# Patient Record
Sex: Female | Born: 1990 | Race: Black or African American | Hispanic: No | Marital: Single | Smoking: Never smoker
Health system: Southern US, Community
[De-identification: ages and names within clinical notes are randomized; demographics above are authoritative.]

---

## 2013-12-07 ENCOUNTER — Emergency Department: Payer: Self-pay | Admitting: Emergency Medicine

## 2013-12-07 LAB — URINALYSIS, COMPLETE
Bacteria: NONE SEEN
Bilirubin,UR: NEGATIVE
Glucose,UR: NEGATIVE mg/dL (ref 0–75)
Ketone: NEGATIVE
Leukocyte Esterase: NEGATIVE
Nitrite: NEGATIVE
Ph: 6 (ref 4.5–8.0)
Protein: NEGATIVE
RBC,UR: 342 /HPF (ref 0–5)
SPECIFIC GRAVITY: 1.012 (ref 1.003–1.030)
WBC UR: 1 /HPF (ref 0–5)

## 2013-12-07 LAB — CBC
HCT: 37.1 % (ref 35.0–47.0)
HGB: 12.8 g/dL (ref 12.0–16.0)
MCH: 31.5 pg (ref 26.0–34.0)
MCHC: 34.4 g/dL (ref 32.0–36.0)
MCV: 92 fL (ref 80–100)
PLATELETS: 167 10*3/uL (ref 150–440)
RBC: 4.05 10*6/uL (ref 3.80–5.20)
RDW: 12.8 % (ref 11.5–14.5)
WBC: 8.3 10*3/uL (ref 3.6–11.0)

## 2013-12-07 LAB — WET PREP, GENITAL

## 2013-12-07 LAB — HCG, QUANTITATIVE, PREGNANCY: BETA HCG, QUANT.: 844 m[IU]/mL — AB

## 2013-12-07 LAB — GC/CHLAMYDIA PROBE AMP

## 2014-08-09 NOTE — L&D Delivery Note (Signed)
Delivery Note  First Stage: Labor onset: 05/26/15 @ 2230  Augmentation : Pitocin Analgesia Eliezer Lofts/Anesthesia intrapartum: Epidural SROM at home at 2400 clear fluid  Second Stage: Complete dilation at 1535 Onset of pushing at 1728 FHR second stage: Baseline 125 / moderate variability / early and variables with contractions to a nadir of 80 / + accels  Delivery of a viable female at [id number] by Carlean JewsMeredith Tykira Wachs, CNM in ROA position - Dr. Dalbert GarnetBeasley in the room for standby No nuchal cord Cord double clamped after cessation of pulsation, cut by CNM Cord blood sample collected  Arterial cord blood sample collected for 1 minute Apgar of 5  Third Stage: Placenta delivered via Tomasa BlaseSchultz intact with 3 VC @ 1839 Placenta disposition: home with patient and FOB Uterine tone firm with massage / bleeding moderate   See Dr. Francena HanlyBeasley's progress note for detail of repair - 3rd degree lac with bilateral labial extensions and stellate vaginal lacs noted -  vaginal packing placed x 2 hours Est. Blood Loss (mL): 400  Complications: extensive repair requiring 1.75 hours and vaginal packing x 2 hours   Mom to postpartum.  Baby to Couplet care / Skin to Skin.  Newborn: Birth Weight: 8#6oz Apgar Scores: 5, 8 Feeding planned: Breast  Karena AddisonMeredith C Haiden Clucas, CNM

## 2014-10-10 LAB — OB RESULTS CONSOLE VARICELLA ZOSTER ANTIBODY, IGG: Varicella: NON-IMMUNE/NOT IMMUNE

## 2014-10-10 LAB — OB RESULTS CONSOLE GC/CHLAMYDIA
Chlamydia: NEGATIVE
Gonorrhea: NEGATIVE

## 2014-10-10 LAB — OB RESULTS CONSOLE HEPATITIS B SURFACE ANTIGEN: HEP B S AG: NEGATIVE

## 2014-10-10 LAB — OB RESULTS CONSOLE HIV ANTIBODY (ROUTINE TESTING): HIV: NONREACTIVE

## 2014-10-10 LAB — OB RESULTS CONSOLE GBS: STREP GROUP B AG: POSITIVE

## 2014-10-10 LAB — OB RESULTS CONSOLE RUBELLA ANTIBODY, IGM: RUBELLA: IMMUNE

## 2015-01-09 ENCOUNTER — Ambulatory Visit: Admission: RE | Admit: 2015-01-09 | Payer: Self-pay | Source: Ambulatory Visit

## 2015-01-13 ENCOUNTER — Ambulatory Visit: Payer: Self-pay

## 2015-05-27 ENCOUNTER — Inpatient Hospital Stay: Payer: 59 | Admitting: Certified Registered"

## 2015-05-27 ENCOUNTER — Inpatient Hospital Stay
Admission: EM | Admit: 2015-05-27 | Discharge: 2015-05-29 | DRG: 775 | Disposition: A | Discharge status: Home / Self Care | Payer: 59 | Attending: Obstetrics and Gynecology | Admitting: Obstetrics and Gynecology

## 2015-05-27 ENCOUNTER — Other Ambulatory Visit: Payer: Self-pay | Admitting: Obstetrics and Gynecology

## 2015-05-27 DIAGNOSIS — O288 Other abnormal findings on antenatal screening of mother: Secondary | ICD-10-CM | POA: Diagnosis present

## 2015-05-27 DIAGNOSIS — Z3A40 40 weeks gestation of pregnancy: Secondary | ICD-10-CM | POA: Diagnosis not present

## 2015-05-27 LAB — CBC
HCT: 36.1 % (ref 35.0–47.0)
Hemoglobin: 12.3 g/dL (ref 12.0–16.0)
MCH: 29.7 pg (ref 26.0–34.0)
MCHC: 33.9 g/dL (ref 32.0–36.0)
MCV: 87.6 fL (ref 80.0–100.0)
PLATELETS: 141 10*3/uL — AB (ref 150–440)
RBC: 4.12 MIL/uL (ref 3.80–5.20)
RDW: 14.8 % — AB (ref 11.5–14.5)
WBC: 9.1 10*3/uL (ref 3.6–11.0)

## 2015-05-27 LAB — CHLAMYDIA/NGC RT PCR (ARMC ONLY)
Chlamydia Tr: NOT DETECTED
N gonorrhoeae: NOT DETECTED

## 2015-05-27 LAB — TYPE AND SCREEN
ABO/RH(D): A NEG
Antibody Screen: NEGATIVE

## 2015-05-27 MED ORDER — BUTORPHANOL TARTRATE 1 MG/ML IJ SOLN: 1.0000 mg | INTRAMUSCULAR | Status: DC | PRN

## 2015-05-27 MED ORDER — ESTROGENS, CONJUGATED 0.625 MG/GM VA CREA
1.0000 | TOPICAL_CREAM | Freq: Every day | VAGINAL | Status: DC
  Administered 2015-05-28 – 2015-05-29 (×2): 1 via VAGINAL
  Filled 2015-05-27 (×2): qty 30

## 2015-05-27 MED ORDER — DIPHENHYDRAMINE HCL 50 MG/ML IJ SOLN: 12.5000 mg | INTRAMUSCULAR | Status: DC | PRN

## 2015-05-27 MED ORDER — OXYCODONE-ACETAMINOPHEN 5-325 MG PO TABS
1.0000 | ORAL_TABLET | ORAL | Status: DC | PRN
  Administered 2015-05-28 (×3): 1 via ORAL
  Filled 2015-05-27 (×3): qty 1

## 2015-05-27 MED ORDER — OXYTOCIN 40 UNITS IN LACTATED RINGERS INFUSION - SIMPLE MED: 1.0000 m[IU]/min | INTRAVENOUS | Status: DC

## 2015-05-27 MED ORDER — SODIUM CHLORIDE 0.9 % IV SOLN
INTRAVENOUS | Status: AC
  Administered 2015-05-27: 1 g via INTRAVENOUS
  Filled 2015-05-27: qty 1000

## 2015-05-27 MED ORDER — ONDANSETRON HCL 4 MG/2ML IJ SOLN: 4.0000 mg | Freq: Four times a day (QID) | INTRAMUSCULAR | Status: DC | PRN

## 2015-05-27 MED ORDER — CITRIC ACID-SODIUM CITRATE 334-500 MG/5ML PO SOLN: 30.0000 mL | ORAL | Status: DC | PRN

## 2015-05-27 MED ORDER — OXYCODONE-ACETAMINOPHEN 5-325 MG PO TABS
ORAL_TABLET | ORAL | Status: AC
  Filled 2015-05-27: qty 1

## 2015-05-27 MED ORDER — OXYTOCIN 40 UNITS IN LACTATED RINGERS INFUSION - SIMPLE MED
1.0000 m[IU]/min | INTRAVENOUS | Status: DC
  Administered 2015-05-27: 7 m[IU]/min via INTRAVENOUS
  Administered 2015-05-27: 9 m[IU]/min via INTRAVENOUS
  Administered 2015-05-27: 1 m[IU]/min via INTRAVENOUS
  Administered 2015-05-27: 5 m[IU]/min via INTRAVENOUS
  Administered 2015-05-27: 11 m[IU]/min via INTRAVENOUS
  Administered 2015-05-27: 3 m[IU]/min via INTRAVENOUS

## 2015-05-27 MED ORDER — LACTATED RINGERS IV SOLN: 500.0000 mL | INTRAVENOUS | Status: DC | PRN

## 2015-05-27 MED ORDER — OXYCODONE-ACETAMINOPHEN 5-325 MG PO TABS: 2.0000 | ORAL_TABLET | ORAL | Status: DC | PRN

## 2015-05-27 MED ORDER — BUPIVACAINE HCL (PF) 0.25 % IJ SOLN
INTRAMUSCULAR | Status: DC | PRN
  Administered 2015-05-27: 5 mL via EPIDURAL

## 2015-05-27 MED ORDER — OXYCODONE-ACETAMINOPHEN 5-325 MG PO TABS: 1.0000 | ORAL_TABLET | ORAL | Status: DC | PRN

## 2015-05-27 MED ORDER — LACTATED RINGERS IV SOLN
INTRAVENOUS | Status: DC
  Administered 2015-05-27: 125 mL/h via INTRAVENOUS

## 2015-05-27 MED ORDER — FENTANYL 2.5 MCG/ML W/ROPIVACAINE 0.2% IN NS 100 ML EPIDURAL INFUSION (ARMC-ANES)
EPIDURAL | Status: AC
  Administered 2015-05-27: 9 mL/h via EPIDURAL
  Filled 2015-05-27: qty 100

## 2015-05-27 MED ORDER — BUTORPHANOL TARTRATE 1 MG/ML IJ SOLN
1.0000 mg | INTRAMUSCULAR | Status: DC | PRN
  Administered 2015-05-27: 1 mg via INTRAVENOUS
  Filled 2015-05-27 (×2): qty 1

## 2015-05-27 MED ORDER — SODIUM CHLORIDE 0.9 % IV SOLN: 2.0000 g | Freq: Once | INTRAVENOUS | Status: DC

## 2015-05-27 MED ORDER — PHENYLEPHRINE 40 MCG/ML (10ML) SYRINGE FOR IV PUSH (FOR BLOOD PRESSURE SUPPORT)
80.0000 ug | PREFILLED_SYRINGE | INTRAVENOUS | Status: DC | PRN
  Filled 2015-05-27: qty 2

## 2015-05-27 MED ORDER — LACTATED RINGERS IV SOLN: 40.0000 [IU] | INTRAVENOUS | Status: DC

## 2015-05-27 MED ORDER — TERBUTALINE SULFATE 1 MG/ML IJ SOLN: 0.2500 mg | Freq: Once | INTRAMUSCULAR | Status: DC | PRN

## 2015-05-27 MED ORDER — OXYTOCIN BOLUS FROM INFUSION: 500.0000 mL | INTRAVENOUS | Status: DC

## 2015-05-27 MED ORDER — ACETAMINOPHEN 325 MG PO TABS: 650.0000 mg | ORAL_TABLET | ORAL | Status: DC | PRN

## 2015-05-27 MED ORDER — LIDOCAINE-EPINEPHRINE (PF) 1.5 %-1:200000 IJ SOLN
INTRAMUSCULAR | Status: DC | PRN
  Administered 2015-05-27: 3 mL via EPIDURAL

## 2015-05-27 MED ORDER — LIDOCAINE HCL (PF) 1 % IJ SOLN
30.0000 mL | INTRAMUSCULAR | Status: DC | PRN
  Filled 2015-05-27: qty 30

## 2015-05-27 MED ORDER — FENTANYL 2.5 MCG/ML W/ROPIVACAINE 0.2% IN NS 100 ML EPIDURAL INFUSION (ARMC-ANES): 9.0000 mL/h | EPIDURAL | Status: DC

## 2015-05-27 MED ORDER — OXYTOCIN 40 UNITS IN LACTATED RINGERS INFUSION - SIMPLE MED
62.5000 mL/h | INTRAVENOUS | Status: DC
  Filled 2015-05-27: qty 1000

## 2015-05-27 MED ORDER — SODIUM CHLORIDE 0.9 % IV SOLN
INTRAVENOUS | Status: AC
  Administered 2015-05-27: 02:00:00
  Filled 2015-05-27: qty 2000

## 2015-05-27 MED ORDER — EPHEDRINE 5 MG/ML INJ
10.0000 mg | INTRAVENOUS | Status: DC | PRN
  Filled 2015-05-27: qty 2

## 2015-05-27 MED ORDER — AMPICILLIN SODIUM 1 G IJ SOLR
1.0000 g | INTRAMUSCULAR | Status: DC
  Administered 2015-05-27 (×3): 1 g via INTRAVENOUS
  Filled 2015-05-27 (×8): qty 1000

## 2015-05-27 NOTE — Progress Notes (Signed)
S:  Doing well, tolerating contractions well with no pain medication  O:  VS: Blood pressure 130/72, pulse 77, temperature 98 F (36.7 C), temperature source Oral, resp. rate 18, height 5\' 3"  (1.6 m), weight 73.483 kg (162 lb).        FHR : baseline 130 / variability Moderate / accelerations + / no decelerations        Toco: contractions every 2-5 minutes        Cervix : Dilation: 2.5 Effacement (%): 80 Cervical Position: Posterior Exam by:: tjs        Membranes: SROM - clear fluid  A: Latent labor     FHR category 1     GBS Bacteruria  P: Continue Pitocin Augmentation     Has received 1 dose antibiotics for GBS prophylaxis       Stadol PRN for pain      Will reassess in 1-2 hours  Karena AddisonMeredith C Sigmon, CNM

## 2015-05-27 NOTE — Brief Op Note (Signed)
Addendum note from vaginal delivery  8:58 PM  PATIENT:  Cynthia Gillespie  24 y.o. female   I was scrubbed and present for the entirety of the vaginal delivery.  After delivery, 3rd degree laceration with bilateral labial extensions and stellate vaginal lacerations were noted. These were repaired in the standard end-to-end fashion with 2-0 and 3-0 Vicryl Rapide for good re-approximation, with a normal rectal exam during and post-repair. After repair, continued mild oozing noted from the proximal vaginal mucosa. Vaginal packing placed for 2 hours.   This repair took 1.75hrs of hands-on attention and active surgical repair.

## 2015-05-27 NOTE — H&P (Signed)
Cynthia Gillespie is a 24 y.o. female presenting for rom at 2400 . Irrregular ctx . History OB History    Gravida Para Term Preterm AB TAB SAB Ectopic Multiple Living   2    1  1         History reviewed. No pertinent past medical history. History reviewed. No pertinent past surgical history. Family History: family history is not on file. Social History:  reports that she has never smoked. She does not have any smokeless tobacco history on file. She reports that she does not drink alcohol or use illicit drugs.   Prenatal Transfer Tool  Maternal Diabetes: No Genetic Screening: Declined Maternal Ultrasounds/Referrals: Normal Fetal Ultrasounds or other Referrals:  None Maternal Substance Abuse:  No Significant Maternal Medications:  None Significant Maternal Lab Results:  None Other Comments:  None  ROS  Dilation: 3 Effacement (%): 80 Exam by:: sca Blood pressure 119/70, pulse 88, temperature 97.9 F (36.6 C), temperature source Oral, resp. rate 20, height 5\' 3"  (1.6 m), weight 162 lb (73.483 kg). Exam Physical Exam  Lungs cta  CV rrr  Abd: soft NT cx 3cm / 90/0 vtx IUPC placed   EFM : 140 + accels , no decels reassuring . Irregular ctx  Prenatal labs: ABO, Rh: --/--/A NEG (10/18 0139) Antibody: NEG (10/18 0139) Rubella:  neg RPR:   neg HBsAg:   neg HIV:   neg GBS:   + urine   Assessment/Plan: ROM with inadequate CTX pattern  Reassuring fetal monitoring  Start Pitocin  Cont ampicillin for gbs prophylaxis    SCHERMERHORN,THOMAS 05/27/2015, 6:28 AM

## 2015-05-27 NOTE — Anesthesia Preprocedure Evaluation (Signed)
Anesthesia Evaluation  Patient identified by MRN, date of birth, ID band Patient awake    Reviewed: Allergy & Precautions, H&P , NPO status , Patient's Chart, lab work & pertinent test results, reviewed documented beta blocker date and time   Airway Mallampati: III  TM Distance: >3 FB Neck ROM: full    Dental no notable dental hx. (+) Teeth Intact   Pulmonary neg pulmonary ROS,    Pulmonary exam normal breath sounds clear to auscultation       Cardiovascular Exercise Tolerance: Good negative cardio ROS Normal cardiovascular exam Rhythm:regular Rate:Normal     Neuro/Psych negative neurological ROS  negative psych ROS   GI/Hepatic negative GI ROS, Neg liver ROS,   Endo/Other  negative endocrine ROS  Renal/GU negative Renal ROS  negative genitourinary   Musculoskeletal   Abdominal   Peds  Hematology negative hematology ROS (+)   Anesthesia Other Findings   Reproductive/Obstetrics (+) Pregnancy                             Anesthesia Physical Anesthesia Plan  ASA: II  Anesthesia Plan: Regional and Epidural   Post-op Pain Management:    Induction:   Airway Management Planned:   Additional Equipment:   Intra-op Plan:   Post-operative Plan:   Informed Consent: I have reviewed the patients History and Physical, chart, labs and discussed the procedure including the risks, benefits and alternatives for the proposed anesthesia with the patient or authorized representative who has indicated his/her understanding and acceptance.     Plan Discussed with: CRNA  Anesthesia Plan Comments:         Anesthesia Quick Evaluation  

## 2015-05-27 NOTE — Anesthesia Procedure Notes (Addendum)
Epidural Patient location during procedure: OB Start time: 05/27/2015 1:30 PM End time: 05/27/2015 1:45 PM  Staffing Anesthesiologist: Yevette EdwardsADAMS, JAMES G Resident/CRNA: Mathews ArgyleLOGAN, Cynthia Gillespie Performed by: resident/CRNA   Preanesthetic Checklist Completed: patient identified, site marked, surgical consent, pre-op evaluation, timeout performed, IV checked, risks and benefits discussed and monitors and equipment checked  Epidural Patient position: sitting Prep: ChloraPrep and site prepped and draped Patient monitoring: heart rate, continuous pulse ox and blood pressure Approach: midline Location: L4-L5 Injection technique: LOR saline  Needle:  Needle type: Tuohy  Needle gauge: 17 G Needle length: 9 cm Needle insertion depth: 6 cm Catheter type: closed end flexible Catheter size: 19 Gauge Catheter at skin depth: 11 cm Test dose: negative and 1.5% lidocaine with Epi 1:200 K  Assessment Events: blood not aspirated, injection not painful, no injection resistance, negative IV test and no paresthesia  Additional Notes   Patient tolerated the insertion well without complications.Reason for block:procedure for pain  Epidural

## 2015-05-28 LAB — CBC
HCT: 30.3 % — ABNORMAL LOW (ref 35.0–47.0)
HEMOGLOBIN: 10.3 g/dL — AB (ref 12.0–16.0)
MCH: 29.6 pg (ref 26.0–34.0)
MCHC: 34 g/dL (ref 32.0–36.0)
MCV: 87.3 fL (ref 80.0–100.0)
Platelets: 121 10*3/uL — ABNORMAL LOW (ref 150–440)
RBC: 3.47 MIL/uL — ABNORMAL LOW (ref 3.80–5.20)
RDW: 14.8 % — AB (ref 11.5–14.5)
WBC: 14 10*3/uL — AB (ref 3.6–11.0)

## 2015-05-28 LAB — RPR: RPR Ser Ql: NONREACTIVE

## 2015-05-28 MED ORDER — PRENATAL MULTIVITAMIN CH
1.0000 | ORAL_TABLET | Freq: Every day | ORAL | Status: DC
Start: 2015-05-28 — End: 2015-05-29
  Administered 2015-05-29: 1 via ORAL
  Filled 2015-05-28: qty 1

## 2015-05-28 MED ORDER — OXYCODONE-ACETAMINOPHEN 5-325 MG PO TABS
2.0000 | ORAL_TABLET | ORAL | Status: DC | PRN
  Administered 2015-05-28: 2 via ORAL
  Filled 2015-05-28: qty 2

## 2015-05-28 MED ORDER — ONDANSETRON HCL 4 MG/2ML IJ SOLN: 4.0000 mg | INTRAMUSCULAR | Status: DC | PRN

## 2015-05-28 MED ORDER — LANOLIN HYDROUS EX OINT: TOPICAL_OINTMENT | CUTANEOUS | Status: DC | PRN

## 2015-05-28 MED ORDER — DOCUSATE SODIUM 100 MG PO CAPS
100.0000 mg | ORAL_CAPSULE | Freq: Two times a day (BID) | ORAL | Status: DC
  Administered 2015-05-28 – 2015-05-29 (×3): 100 mg via ORAL
  Filled 2015-05-28 (×3): qty 1

## 2015-05-28 MED ORDER — BENZOCAINE-MENTHOL 20-0.5 % EX AERO
1.0000 "application " | INHALATION_SPRAY | Freq: Three times a day (TID) | CUTANEOUS | Status: DC
  Administered 2015-05-28 – 2015-05-29 (×3): 1 via TOPICAL
  Filled 2015-05-28: qty 56

## 2015-05-28 MED ORDER — POLYSACCHARIDE IRON COMPLEX 150 MG PO CAPS
150.0000 mg | ORAL_CAPSULE | Freq: Every day | ORAL | Status: DC
Start: 2015-05-28 — End: 2015-05-29
  Administered 2015-05-28 – 2015-05-29 (×2): 150 mg via ORAL
  Filled 2015-05-28 (×2): qty 1

## 2015-05-28 MED ORDER — ONDANSETRON HCL 4 MG PO TABS: 4.0000 mg | ORAL_TABLET | ORAL | Status: DC | PRN

## 2015-05-28 MED ORDER — HYDROCORTISONE 1 % EX CREA
TOPICAL_CREAM | Freq: Four times a day (QID) | CUTANEOUS | Status: DC
  Administered 2015-05-29: 10:00:00 via TOPICAL
  Filled 2015-05-28: qty 28

## 2015-05-28 MED ORDER — HYDROCORTISONE 2.5 % RE CREA
TOPICAL_CREAM | Freq: Three times a day (TID) | RECTAL | Status: DC
  Administered 2015-05-28: 16:00:00 via RECTAL
  Filled 2015-05-28 (×2): qty 28.35

## 2015-05-28 MED ORDER — IBUPROFEN 600 MG PO TABS
600.0000 mg | ORAL_TABLET | Freq: Four times a day (QID) | ORAL | Status: DC
  Administered 2015-05-28 – 2015-05-29 (×5): 600 mg via ORAL
  Filled 2015-05-28 (×5): qty 1

## 2015-05-28 MED ORDER — DIPHENHYDRAMINE HCL 25 MG PO CAPS: 25.0000 mg | ORAL_CAPSULE | Freq: Four times a day (QID) | ORAL | Status: DC | PRN

## 2015-05-28 MED ORDER — DIBUCAINE 1 % RE OINT
1.0000 | TOPICAL_OINTMENT | Freq: Four times a day (QID) | RECTAL | Status: DC
Start: 2015-05-28 — End: 2015-05-29
  Administered 2015-05-29: 1 via RECTAL

## 2015-05-28 MED ORDER — ZOLPIDEM TARTRATE 5 MG PO TABS: 5.0000 mg | ORAL_TABLET | Freq: Every evening | ORAL | Status: DC | PRN

## 2015-05-28 MED ORDER — RHO D IMMUNE GLOBULIN 1500 UNIT/2ML IJ SOSY
300.0000 ug | PREFILLED_SYRINGE | Freq: Once | INTRAMUSCULAR | Status: AC
  Administered 2015-05-28: 300 ug via INTRAVENOUS
  Filled 2015-05-28: qty 2

## 2015-05-28 MED ORDER — ACETAMINOPHEN 325 MG PO TABS: 650.0000 mg | ORAL_TABLET | ORAL | Status: DC | PRN

## 2015-05-28 MED ORDER — WITCH HAZEL-GLYCERIN EX PADS
1.0000 "application " | MEDICATED_PAD | CUTANEOUS | Status: DC | PRN
  Administered 2015-05-28 – 2015-05-29 (×2): 1 via TOPICAL
  Filled 2015-05-28: qty 100

## 2015-05-28 MED ORDER — VARICELLA VIRUS VACCINE LIVE 1350 PFU/0.5ML IJ SUSR
0.5000 mL | INTRAMUSCULAR | Status: AC | PRN
  Administered 2015-05-29: 0.5 mL via SUBCUTANEOUS
  Filled 2015-05-28: qty 0.5

## 2015-05-28 MED ORDER — SIMETHICONE 80 MG PO CHEW
80.0000 mg | CHEWABLE_TABLET | ORAL | Status: DC | PRN
  Administered 2015-05-29: 80 mg via ORAL
  Filled 2015-05-28: qty 1

## 2015-05-28 MED ORDER — SENNOSIDES-DOCUSATE SODIUM 8.6-50 MG PO TABS: 2.0000 | ORAL_TABLET | ORAL | Status: DC

## 2015-05-28 NOTE — Progress Notes (Signed)
PPD #1, SVD, baby boy "JJ"  S:  Reports feeling good, but tired - did not get much rest last night             Tolerating po/ No nausea or vomiting             Bleeding is light             Pain controlled with Motrin, Percocet, Dermoplast spray             Up ad lib / ambulatory / voiding QS  Newborn breast feeding - going well   O:               VS: BP 120/78 mmHg  Pulse 91  Temp(Src) 98 F (36.7 C) (Oral)  Resp 18  Ht 5\' 3"  (1.6 m)  Wt 73.483 kg (162 lb)  BMI 28.70 kg/m2  SpO2 100%  Breastfeeding? Unknown   LABS:              Recent Labs  05/27/15 0139 05/28/15 0546  WBC 9.1 14.0*  HGB 12.3 10.3*  PLT 141* 121*               Blood type: --/--/A NEG (10/18 0139)  Rubella: Immune (03/03 0000)                     I&O: Intake/Output      10/18 0701 - 10/19 0700 10/19 0701 - 10/20 0700   P.O. 240    I.V. (mL/kg) 3187 (43.4)    Total Intake(mL/kg) 3427 (46.6)    Urine (mL/kg/hr) 900 (0.5) 500 (3.9)   Total Output 900 500   Net +2527 -500                      Physical Exam:             Alert and oriented X3  Lungs: Clear and unlabored  Heart: regular rate and rhythm / no mumurs  Abdomen: soft, non-tender, non-distended              Fundus: firm, non-tender, U-E  Perineum: well-approximated third degree laceration and labial laceration / +edema / slight erythema / no significant ecchymosis/ external hemorrhoid noted - has had during antepartum as well   Lochia: small, no clots  Extremities: no edema, no calf pain or tenderness    A: PPD # 1, SVD  S/p Third degree laceration  Mild ABL Anemia   External Hemorrhoid  RH - Negative - Baby is Positive  Varicella Non-immune  Doing well - stable status  P: Routine post partum orders   Continue ice packs to perineum with dermoplast spray  See lactation today  Begin Niferex today  Colace BID   Rhogam today  Anusol-HC cream TID   Plan for Varicella vaccine tomorrow prior to discharge  Anticipate d/c home  tomorrow  Karena AddisonMeredith C Sigmon, CNM

## 2015-05-28 NOTE — Anesthesia Postprocedure Evaluation (Signed)
  Anesthesia Post-op Note  Patient: Cynthia Gillespie  Procedure(s) Performed: * No procedures listed *  Anesthesia type:No value filed.  Patient location: 343  Post pain: Pain level controlled  Post assessment: Post-op Vital signs reviewed, Patient's Cardiovascular Status Stable, Respiratory Function Stable, Patent Airway and No signs of Nausea or vomiting  Post vital signs: Reviewed and stable  Last Vitals:  Filed Vitals:   05/28/15 0442  BP: 123/75  Pulse: 92  Temp: 36.9 C  Resp: 20    Level of consciousness: awake, alert  and patient cooperative  Complications: No apparent anesthesia complications

## 2015-05-29 MED ORDER — POLYSACCHARIDE IRON COMPLEX 150 MG PO CAPS: 150.0000 mg | ORAL_CAPSULE | Freq: Every day | ORAL | Status: AC

## 2015-05-29 MED ORDER — DOCUSATE SODIUM 100 MG PO CAPS: 100.0000 mg | ORAL_CAPSULE | Freq: Two times a day (BID) | ORAL | Status: AC

## 2015-05-29 NOTE — Discharge Summary (Signed)
Obstetric Discharge Summary   Patient ID: Cynthia Gillespie MRN: [id number] DOB/AGE: [birth year] 23 y.o.   Date of Admission: 05/27/2015  Date of Discharge: 05/29/15  Admitting Diagnosis: Induction of labor at 1719w5d  Secondary Diagnosis: 3rd degree repair  Mode of Delivery: normal spontaneous vaginal delivery     Discharge Diagnosis: NSVD of viable female with 3rd deg repair   Intrapartum Procedures: fetal and uterine monitoring and epidural for pain  Post partum procedures: Colace  Complications: 3rd degree perineal laceration   Brief Hospital Course  Cynthia Gillespie is a [id number] who had a SVD on 05/27/15;  for further details of this delivery, please refer to the delivery note.  Patient had an uncomplicated postpartum course.  By time of discharge on PPD#2, her pain was controlled on oral pain medications; she had appropriate lochia and was ambulating, voiding without difficulty and tolerating regular diet.  She was deemed stable for discharge to home.  Nursing infant well.     Labs: CBC Latest Ref Rng 05/28/2015 05/27/2015 12/07/2013  WBC 3.6 - 11.0 K/uL 14.0(H) 9.1 8.3  Hemoglobin 12.0 - 16.0 g/dL 10.3(L) 12.3 12.8  Hematocrit 35.0 - 47.0 % 30.3(L) 36.1 37.1  Platelets 150 - 440 K/uL 121(L) 141(L) 167   A NEG  Physical exam:  Blood pressure 122/84, pulse 79, temperature 98 F (36.7 C), temperature source Oral, resp. rate 18, height 5\' 3"  (1.6 m), weight 162 lb (73.483 kg), SpO2 100 %, unknown if currently breastfeeding. General: alert and no distress Lochia: appropriate Abdomen: soft, NT Uterine Fundus: firm Incision: healing well, no significant drainage, no dehiscence, no significant erythema Extremities: No evidence of DVT seen on physical exam. No lower extremity edema.  Discharge Instructions: Per After Visit Summary. Activity: Advance as tolerated. Pelvic rest for 6 weeks.  Also refer to After Visit Summary Diet: Regular Medications:   Medication List    Notice     You have not been prescribed any medications.     Outpatient follow up: 6 weeks Postpartum contraception: no method  Discharged Condition: stable  Discharged NW:GNFAto:Home   Newborn Data:   Disposition:home with mother  Apgars: APGAR (1 MIN): 5   APGAR (5 MINS): 8   APGAR (10 MINS):    Baby Feeding: Breast  Cynthia Gillespie Cynthia Gillespie, CNM 05/29/2015

## 2015-05-29 NOTE — Discharge Instructions (Signed)
Care After Vaginal Delivery °Congratulations on your new baby!! ° °Refer to this sheet in the next few weeks. These discharge instructions provide you with information on caring for yourself after delivery. Your caregiver may also give you specific instructions. Your treatment has been planned according to the most current medical practices available, but problems sometimes occur. Call your caregiver if you have any problems or questions after you go home. ° °HOME CARE INSTRUCTIONS °· Take over-the-counter or prescription medicines only as directed by your caregiver or pharmacist. °· Do not drink alcohol, especially if you are breastfeeding or taking medicine to relieve pain. °· Do not chew or smoke tobacco. °· Do not use illegal drugs. °· Continue to use good perineal care. Good perineal care includes: °¨ Wiping your perineum from front to back. °¨ Keeping your perineum clean. °· Do not use tampons or douche until your caregiver says it is okay. °· Shower, wash your hair, and take tub baths as directed by your caregiver. °· Wear a well-fitting bra that provides breast support. °· Eat healthy foods. °· Drink enough fluids to keep your urine clear or pale yellow. °· Eat high-fiber foods such as whole grain cereals and breads, brown rice, beans, and fresh fruits and vegetables every day. These foods may help prevent or relieve constipation. °· Follow your caregiver's recommendations regarding resumption of activities such as climbing stairs, driving, lifting, exercising, or traveling. Specifically, no driving for two weeks, so that you are comfortable reacting quickly in an emergency. °· Talk to your caregiver about resuming sexual activities. Resumption of sexual activities is dependent upon your risk of infection, your rate of healing, and your comfort and desire to resume sexual activity. Usually we recommend waiting about six weeks, or until your bleeding stops and you are interested in sex. °· Try to have someone  help you with your household activities and your newborn for at least a few days after you leave the hospital. Even longer is better. °· Rest as much as possible. Try to rest or take a nap when your newborn is sleeping. Sleep deprivation can be very hard after delivery. °· Increase your activities gradually. °· Keep all of your scheduled postpartum appointments. It is very important to keep your scheduled follow-up appointments. At these appointments, your caregiver will be checking to make sure that you are healing physically and emotionally. ° °SEEK MEDICAL CARE IF:  °· You are passing large clots from your vagina.  °· You have a foul smelling discharge from your vagina. °· You have trouble urinating. °· You are urinating frequently. °· You have pain when you urinate. °· You have a change in your bowel movements. °· You have increasing redness, pain, or swelling near your vaginal incision (episiotomy) or vaginal tear. °· You have pus draining from your episiotomy or vaginal tear. °· Your episiotomy or vaginal tear is separating. °· You have painful, hard, or reddened breasts. °· You have a severe headache. °· You have blurred vision or see spots. °· You feel sad or depressed. °· You have thoughts of hurting yourself or your newborn. °· You have questions about your care, the care of your newborn, or medicines. °· You are dizzy or light-headed. °· You have a rash. °· You have nausea or vomiting. °· You were breastfeeding and have not had a menstrual period within 12 weeks after you stopped breastfeeding. °· You are not breastfeeding and have not had a menstrual period by the 12th week after delivery. °· You   have a fever.  SEEK IMMEDIATE MEDICAL CARE IF:   You have persistent pain.  You have chest pain.  You have shortness of breath.  You faint.  You have leg pain.  You have stomach pain.  Your vaginal bleeding saturates two or more sanitary pads in 1 hour.  MAKE SURE YOU:   Understand these  instructions.  Will get help right away if you are not doing well or get worse.  Keep taking your Iron  Take colace 100 mg po BID   Document Released: 07/23/2000 Document Revised: 12/10/2013 Document Reviewed: 03/22/2012  [facility] Patient Information 2015 [employer]. This information is not intended to replace advice given to you by your health care provider. Make sure you discuss any questions you have with your health care provider.

## 2015-05-29 NOTE — Lactation Note (Signed)
This note was copied from the chart of Cynthia Myldred Steier. Lactation Consultation Note  Patient Name: Cynthia Gillespie KGMWN'UToday's Date: 05/29/2015 Reason for consult: Follow-up assessment   Maternal Data   MOTEHR WITH INVERTED NIPPLE, NIPPLE SHIELD IS BEING USED. Feeding    LATCH Score/Interventions                      Lactation Tools Discussed/Used     Consult Status      Cynthia Gillespie 05/29/2015, 12:40 PM

## 2020-12-27 ENCOUNTER — Emergency Department: Payer: Medicaid Other

## 2020-12-27 ENCOUNTER — Emergency Department
Admission: EM | Admit: 2020-12-27 | Discharge: 2020-12-28 | Disposition: A | Discharge status: Home / Self Care | Payer: Medicaid Other | Attending: Emergency Medicine | Admitting: Emergency Medicine

## 2020-12-27 ENCOUNTER — Other Ambulatory Visit: Payer: Self-pay

## 2020-12-27 ENCOUNTER — Encounter: Payer: Self-pay | Admitting: Emergency Medicine

## 2020-12-27 DIAGNOSIS — Z79899 Other long term (current) drug therapy: Secondary | ICD-10-CM | POA: Diagnosis not present

## 2020-12-27 DIAGNOSIS — O200 Threatened abortion: Secondary | ICD-10-CM | POA: Diagnosis not present

## 2020-12-27 DIAGNOSIS — Z3A00 Weeks of gestation of pregnancy not specified: Secondary | ICD-10-CM | POA: Insufficient documentation

## 2020-12-27 DIAGNOSIS — O209 Hemorrhage in early pregnancy, unspecified: Secondary | ICD-10-CM | POA: Diagnosis present

## 2020-12-27 LAB — BASIC METABOLIC PANEL
Anion gap: 9 (ref 5–15)
BUN: 10 mg/dL (ref 6–20)
CO2: 22 mmol/L (ref 22–32)
Calcium: 9.3 mg/dL (ref 8.9–10.3)
Chloride: 104 mmol/L (ref 98–111)
Creatinine, Ser: 0.84 mg/dL (ref 0.44–1.00)
GFR, Estimated: >60 mL/min (ref >60)
Glucose, Bld: 90 mg/dL (ref 70–99)
Potassium: 3.5 mmol/L (ref 3.5–5.1)
Sodium: 135 mmol/L (ref 135–145)

## 2020-12-27 LAB — CBC WITH DIFFERENTIAL/PLATELET
Abs Immature Granulocytes: 0.01 10*3/uL (ref 0.00–0.07)
Basophils Absolute: 0.1 10*3/uL (ref 0.0–0.1)
Basophils Relative: 1 %
Eosinophils Absolute: 0.2 10*3/uL (ref 0.0–0.5)
Eosinophils Relative: 3 %
HCT: 34.2 % — ABNORMAL LOW (ref 36.0–46.0)
Hemoglobin: 12 g/dL (ref 12.0–15.0)
Immature Granulocytes: 0 %
Lymphocytes Relative: 36 %
Lymphs Abs: 2.3 10*3/uL (ref 0.7–4.0)
MCH: 30.8 pg (ref 26.0–34.0)
MCHC: 35.1 g/dL (ref 30.0–36.0)
MCV: 87.9 fL (ref 80.0–100.0)
Monocytes Absolute: 0.4 10*3/uL (ref 0.1–1.0)
Monocytes Relative: 6 %
Neutro Abs: 3.5 10*3/uL (ref 1.7–7.7)
Neutrophils Relative %: 54 %
Platelets: 215 10*3/uL (ref 150–400)
RBC: 3.89 MIL/uL (ref 3.87–5.11)
RDW: 12.3 % (ref 11.5–15.5)
WBC: 6.4 10*3/uL (ref 4.0–10.5)
nRBC: 0 % (ref 0.0–0.2)

## 2020-12-27 LAB — POC URINE PREG, ED: Preg Test, Ur: POSITIVE — AB

## 2020-12-27 LAB — HCG, QUANTITATIVE, PREGNANCY: hCG, Beta Chain, Quant, S: 934 m[IU]/mL — ABNORMAL HIGH (ref <5)

## 2020-12-27 MED ORDER — RHO D IMMUNE GLOBULIN 1500 UNIT/2ML IJ SOSY
300.0000 ug | PREFILLED_SYRINGE | Freq: Once | INTRAMUSCULAR | Status: AC
  Administered 2020-12-28: 300 ug via INTRAMUSCULAR
  Filled 2020-12-27: qty 2

## 2020-12-27 NOTE — ED Triage Notes (Signed)
Pt presents via pov with c/o vaginal bleeding that began yesterday. Pt unsure how far along she is. Pt noticed spotting bleeding and discharge. Pt reports this is 3rd pregnancy, hx of miscarriage. Pt denies dizziness or other symptoms. Pt OB doctor at [facility].

## 2020-12-27 NOTE — ED Notes (Signed)
Lab called to add on HCG level

## 2020-12-28 LAB — ABO/RH: ABO/RH(D): A NEG

## 2020-12-28 LAB — WET PREP, GENITAL
Clue Cells Wet Prep HPF POC: NONE SEEN
Sperm: NONE SEEN
Trich, Wet Prep: NONE SEEN

## 2020-12-28 LAB — CHLAMYDIA/NGC RT PCR (ARMC ONLY)
Chlamydia Tr: NOT DETECTED
N gonorrhoeae: NOT DETECTED

## 2020-12-28 LAB — ANTIBODY SCREEN: Antibody Screen: NEGATIVE

## 2020-12-28 NOTE — ED Provider Notes (Signed)
[facility] Emergency Department Provider Note  ____________________________________________  Time seen: Approximately 1:24 AM  I have reviewed the triage vital signs and the nursing notes.   HISTORY  Chief Complaint Vaginal Bleeding   HPI Cynthia Gillespie is a 30 y.o. female G3P1A1 presents for evaluation of vaginal bleeding.   LMP 6 weeks ago.  Started with vaginal spotting yesterday which became slightly worse today.  Patient has not established care for this pregnancy yet.  Denies dizziness, abdominal pain, chest pain, shortness of breath.  The bleeding has not been severe.  Patient has a history of Rh- blood requiring RhoGAM with prior pregnancies  History reviewed. No pertinent past medical history.  Patient Active Problem List   Diagnosis Date Noted  . Postpartum care following vaginal delivery 05/28/2015  . Labor and delivery, indication for care 05/27/2015    History reviewed. No pertinent surgical history.  Prior to Admission medications   Medication Sig Start Date End Date Taking? Authorizing Provider  docusate sodium (COLACE) 100 MG capsule Take 1 capsule (100 mg total) by mouth 2 (two) times daily. 05/29/15   Sharee Pimple, CNM  iron polysaccharides (NIFEREX) 150 MG capsule Take 1 capsule (150 mg total) by mouth daily. 05/29/15   Sharee Pimple, CNM    Allergies Patient has no known allergies.  History reviewed. No pertinent family history.  Social History Social History   Tobacco Use  . Smoking status: Never Smoker  Substance Use Topics  . Alcohol use: No  . Drug use: No    Review of Systems  Constitutional: Negative for fever. Eyes: Negative for visual changes. ENT: Negative for sore throat. Neck: No neck pain  Cardiovascular: Negative for chest pain. Respiratory: Negative for shortness of breath. Gastrointestinal: Negative for abdominal pain, vomiting or diarrhea. Genitourinary: Negative for dysuria. + vaginal  bleeding Musculoskeletal: Negative for back pain. Skin: Negative for rash. Neurological: Negative for headaches, weakness or numbness. Psych: No SI or HI  ____________________________________________   PHYSICAL EXAM:  VITAL SIGNS: Vitals:   12/28/20 0021 12/28/20 0052  BP: 114/76 118/68  Pulse: 77 78  Resp:  17  Temp:  98.3 F (36.8 C)  SpO2: 99% 99%    Constitutional: Alert and oriented. Well appearing and in no apparent distress. HEENT:      Head: Normocephalic and atraumatic.         Eyes: Conjunctivae are normal. Sclera is non-icteric.       Mouth/Throat: Mucous membranes are moist.       Neck: Supple with no signs of meningismus. Cardiovascular: Regular rate and rhythm. No murmurs, gallops, or rubs. Respiratory: Normal respiratory effort. Lungs are clear to auscultation bilaterally.  Gastrointestinal: Soft, non tender, and non distended Pelvic exam: Normal external genitalia, no rashes or lesions. Normal cervical mucus. Os closed. No cervical motion tenderness.  No uterine or adnexal tenderness.   Musculoskeletal:  No edema, cyanosis, or erythema of extremities. Neurologic: Normal speech and language. Face is symmetric. Moving all extremities. No gross focal neurologic deficits are appreciated. Skin: Skin is warm, dry and intact. No rash noted. Psychiatric: Mood and affect are normal. Speech and behavior are normal.  ____________________________________________   LABS (all labs ordered are listed, but only abnormal results are displayed)  Labs Reviewed  WET PREP, GENITAL - Abnormal; Notable for the following components:      Result Value   Yeast Wet Prep HPF POC PRESENT (*)    WBC, Wet Prep HPF POC MODERATE (*)  All other components within normal limits  CBC WITH DIFFERENTIAL/PLATELET - Abnormal; Notable for the following components:   HCT 34.2 (*)    All other components within normal limits  HCG, QUANTITATIVE, PREGNANCY - Abnormal; Notable for the following  components:   hCG, Beta Chain, Quant, S 934 (*)    All other components within normal limits  POC URINE PREG, ED - Abnormal; Notable for the following components:   Preg Test, Ur POSITIVE (*)    All other components within normal limits  CHLAMYDIA/NGC RT PCR ([facility] ONLY)  BASIC METABOLIC PANEL  ABO/RH  RH IG WORKUP (INCLUDES ABO/RH)  ANTIBODY SCREEN  RHOGAM INJECTION   ____________________________________________  EKG  none  ____________________________________________  RADIOLOGY  I have personally reviewed the images performed during this visit and I agree with the Radiologist's read.   Interpretation by Radiologist:  US OB Comp < 14 Wks  Result Date: 12/27/2020 CLINICAL DATA:  Pregnant patient in first-trimester pregnancy with vaginal bleeding. Beta HCG 934. Gestational age by LMP 6 weeks 1 day. EXAM: OBSTETRIC <14 WK [place] AND TRANSVAGINAL OB US TECHNIQUE: Both transabdominal and transvaginal ultrasound examinations were performed for complete evaluation of the gestation as well as the maternal uterus, adnexal regions, and pelvic cul-de-sac. Transvaginal technique was performed to assess early pregnancy. COMPARISON:  None. FINDINGS: Intrauterine gestational sac: Single Yolk sac:  Visualized. Embryo:  Not Visualized. Cardiac Activity: Not Visualized. MSD: 4.2 mm   5 w   1 d Subchorionic hemorrhage:  None visualized. Maternal uterus/adnexae: Both ovaries are visualized. There is a small corpus luteal cyst in the left ovary. The right ovary is normal. Ovarian blood flow is seen. No adnexal mass or pelvic free fluid. IMPRESSION: 1. Early intrauterine gestational sac, containing a yolk sac, but no fetal pole or cardiac activity yet visualized. Recommend follow-up quantitative B-HCG levels and follow-up US in 7-10 days to assess viability. 2. No adnexal mass.  Normal sonographic appearance of the ovaries. Electronically Signed   By: Narda Rutherford M.D.   On: 12/27/2020 23:27   US  OB Transvaginal  Result Date: 12/27/2020 CLINICAL DATA:  Pregnant patient in first-trimester pregnancy with vaginal bleeding. Beta HCG 934. Gestational age by LMP 6 weeks 1 day. EXAM: OBSTETRIC <14 WK [place] AND TRANSVAGINAL OB US TECHNIQUE: Both transabdominal and transvaginal ultrasound examinations were performed for complete evaluation of the gestation as well as the maternal uterus, adnexal regions, and pelvic cul-de-sac. Transvaginal technique was performed to assess early pregnancy. COMPARISON:  None. FINDINGS: Intrauterine gestational sac: Single Yolk sac:  Visualized. Embryo:  Not Visualized. Cardiac Activity: Not Visualized. MSD: 4.2 mm   5 w   1 d Subchorionic hemorrhage:  None visualized. Maternal uterus/adnexae: Both ovaries are visualized. There is a small corpus luteal cyst in the left ovary. The right ovary is normal. Ovarian blood flow is seen. No adnexal mass or pelvic free fluid. IMPRESSION: 1. Early intrauterine gestational sac, containing a yolk sac, but no fetal pole or cardiac activity yet visualized. Recommend follow-up quantitative B-HCG levels and follow-up US in 7-10 days to assess viability. 2. No adnexal mass.  Normal sonographic appearance of the ovaries. Electronically Signed   By: Narda Rutherford M.D.   On: 12/27/2020 23:27     ____________________________________________   PROCEDURES  Procedure(s) performed:yes .1-3 Lead EKG Interpretation Performed by: Nita Sickle, MD Authorized by: Nita Sickle, MD     Interpretation: non-specific     ECG rate assessment: normal     Rhythm: sinus rhythm  Ectopy: none     Conduction: normal     Critical Care performed:  None ____________________________________________   INITIAL IMPRESSION / ASSESSMENT AND PLAN / ED COURSE  30 y.o. female G3P1A1 presents for evaluation of vaginal bleeding.   LMP 6 weeks ago.  Hemodynamically stable.  Abdomen soft and nontender.  Pelvic exam with no significant abnormality.   Pelvic swabs were done.  Hemoglobin is stable at 12.  Blood type A-, will give RhoGAM.  Beta quant of 934 and a transvaginal ultrasound showing an early intrauterine gestational sac with a yolk sac but no fetal pole or cardiac activity.  We discussed follow-up with her OB/GYN in a week for repeat ultrasound and trending of beta quant.  At this time explained to the patient that it is unclear if this is an abnormal pregnancy or just a very early pregnancy.  Discussed signs and symptoms of acute blood loss anemia recommended the patient return to the emergency room if these develop.  Also recommended return to the emergency room she has severe abdominal pain.  Discussed pelvic rest.       _____________________________________________ Please note:  Patient was evaluated in Emergency Department today for the symptoms described in the history of present illness. Patient was evaluated in the context of the global COVID-19 pandemic, which necessitated consideration that the patient might be at risk for infection with the SARS-CoV-2 virus that causes COVID-19. Institutional protocols and algorithms that pertain to the evaluation of patients at risk for COVID-19 are in a state of rapid change based on information released by regulatory bodies including the CDC and federal and state organizations. These policies and algorithms were followed during the patient's care in the ED.  Some ED evaluations and interventions may be delayed as a result of limited staffing during the pandemic.   [place] Controlled Substance Database was reviewed by me. ____________________________________________   FINAL CLINICAL IMPRESSION(S) / ED DIAGNOSES   Final diagnoses:  Threatened miscarriage      NEW MEDICATIONS STARTED DURING THIS VISIT:  ED Discharge Orders    None       Note:  This document was prepared using Dragon voice recognition software and may include unintentional dictation errors.    Nita Sickle,  MD 12/28/20 [phone]

## 2020-12-28 NOTE — Discharge Instructions (Signed)
As we discussed, your ultrasound and pregnancy hormones show that this is either a very early pregnancy or an abnormal pregnancy.  You need to follow-up with your OB/GYN in a week for repeat ultrasound and blood work.  In the meantime we recommend pelvic rest which means no sexual activity or tampons in the vagina until 48 hours of no bleeding.  Return to the ER for severe abdominal pain or severe bleeding

## 2020-12-29 LAB — RHOGAM INJECTION: Unit division: 0

## 2021-12-19 IMAGING — US US OB COMP LESS 14 WK
1 series · 14 of 28 positions shown · non-contrast
Comparison: None.

CLINICAL DATA: Pregnant patient in first-trimester pregnancy with
vaginal bleeding. Beta HCG 934. Gestational age by LMP 6 weeks 1
day.

EXAM:
OBSTETRIC <14 WK US AND TRANSVAGINAL OB US
TECHNIQUE: Both transabdominal and transvaginal ultrasound examinations were
performed for complete evaluation of the gestation as well as the
maternal uterus, adnexal regions, and pelvic cul-de-sac.
Transvaginal technique was performed to assess early pregnancy.

[Series 1: us ob transvaginal · 14 of 42 slices shown]
[im 2/42]
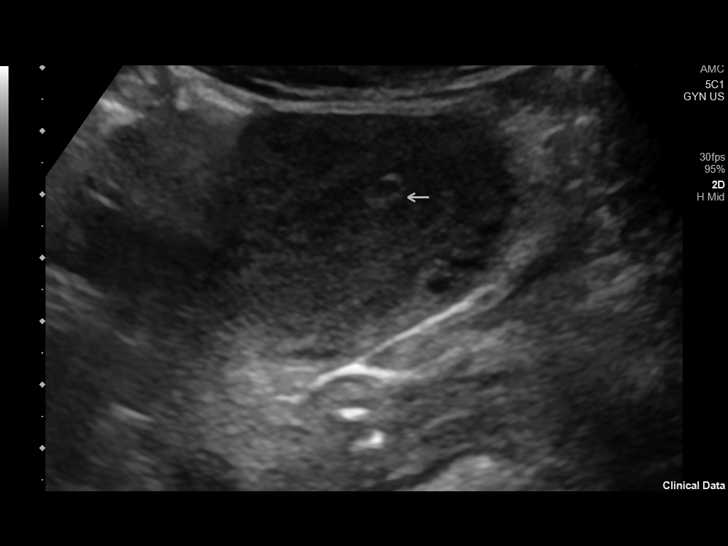
[im 5/42]
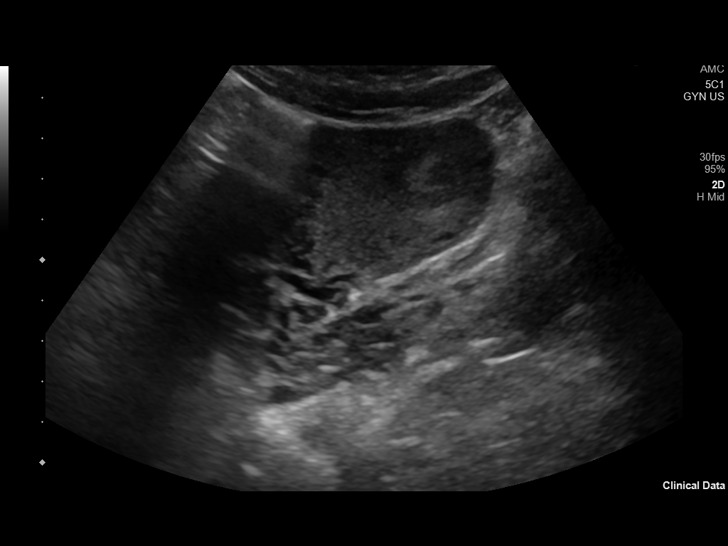
[im 8/42]
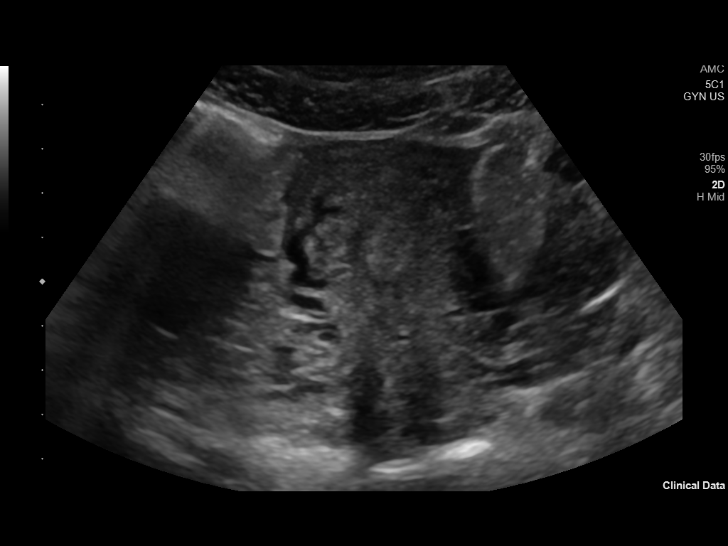
[im 11/42]
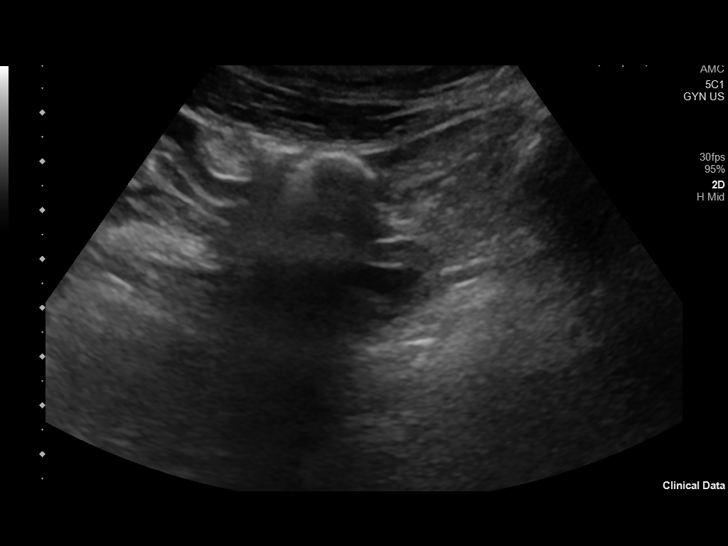
[im 14/42]
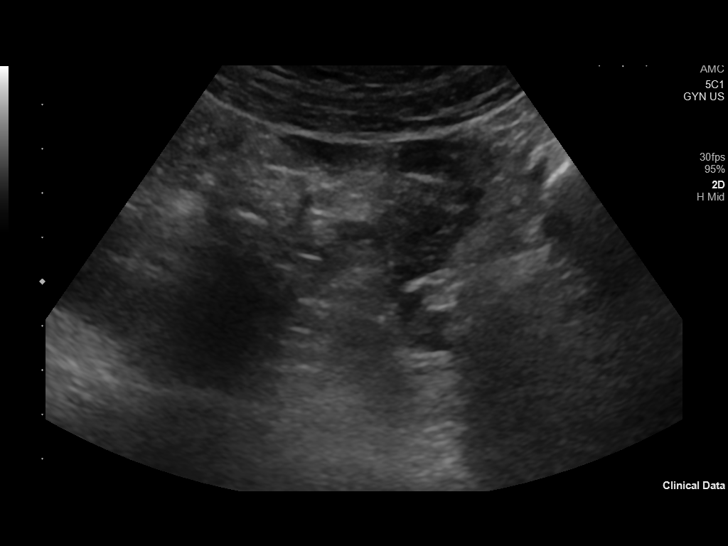
[im 17/42]
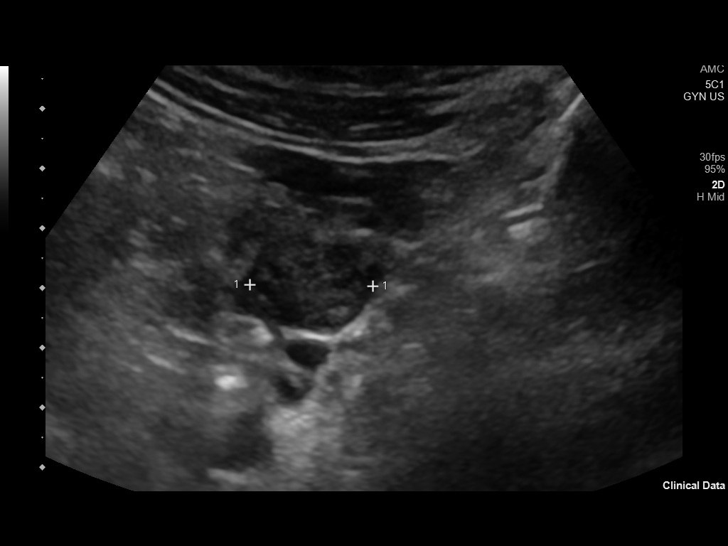
[im 20/42]
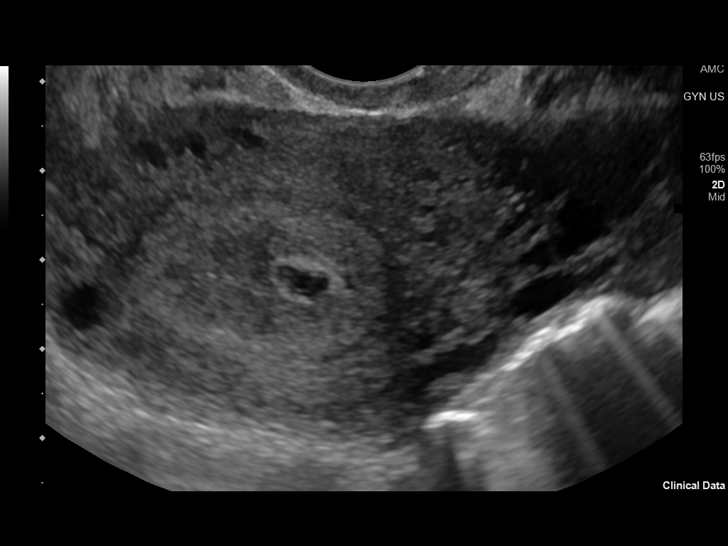
[im 23/42]
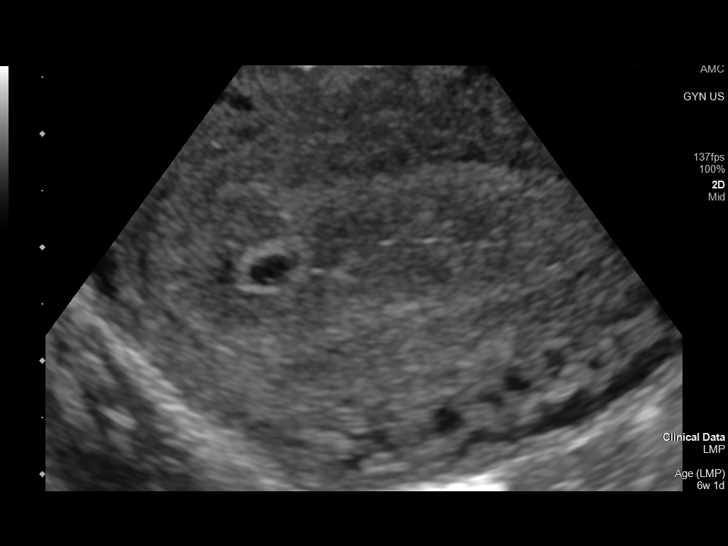
[im 26/42]
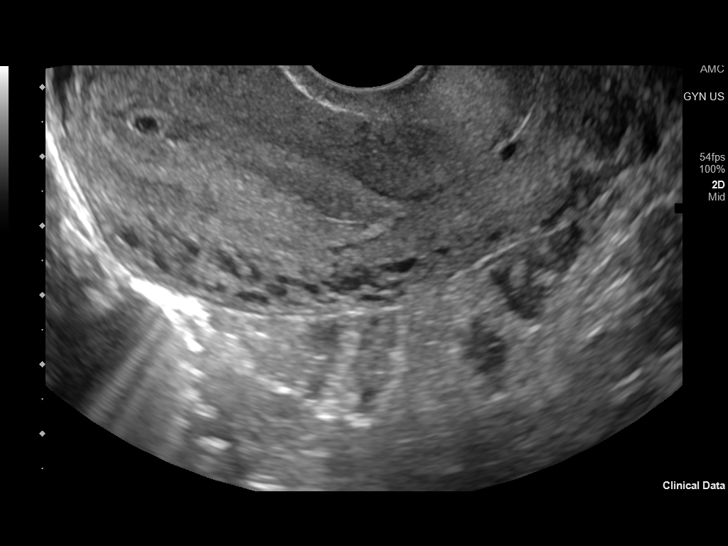
[im 29/42]
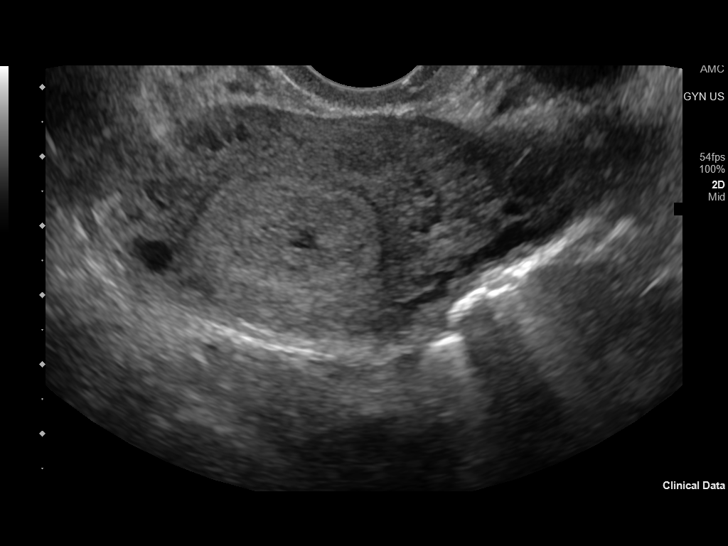
[im 32/42]
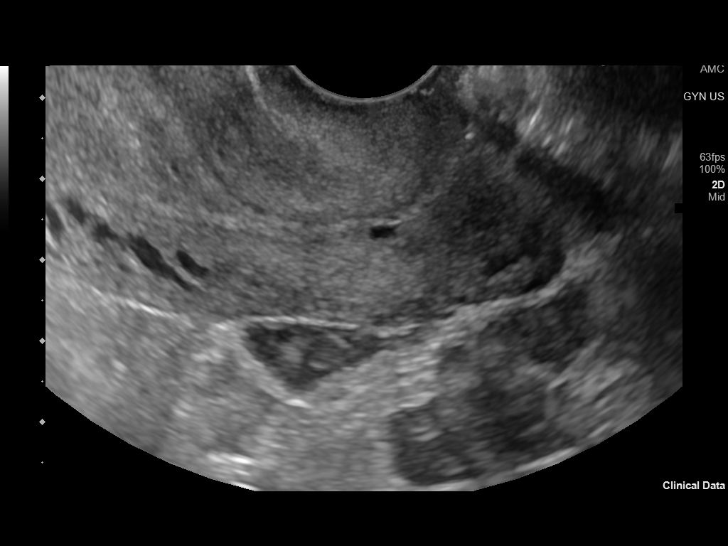
[im 35/42]
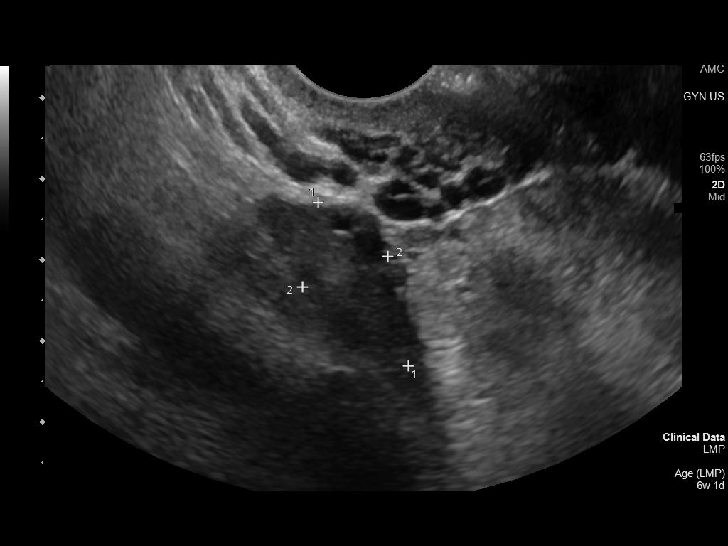
[im 38/42]
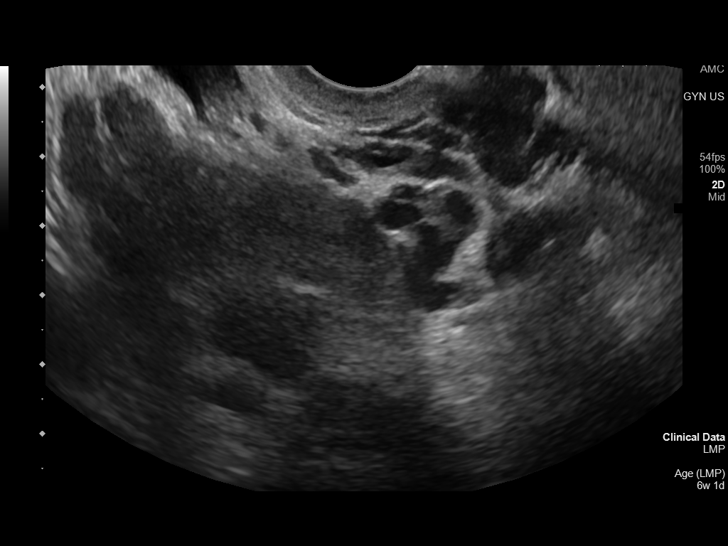
[im 42/42]
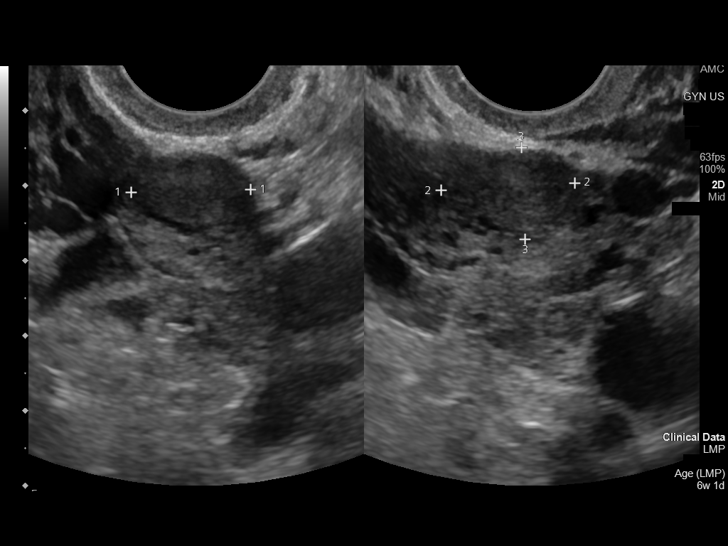

[14 of 28 positions shown; findings below may reference images not displayed]

FINDINGS: Intrauterine gestational sac: Single

Yolk sac:  Visualized.

Embryo:  Not Visualized.

Cardiac Activity: Not Visualized.

MSD: 4.2 mm   5 w   1 d

Subchorionic hemorrhage:  None visualized.

Maternal uterus/adnexae: Both ovaries are visualized. There is a
small corpus luteal cyst in the left ovary. The right ovary is
normal. Ovarian blood flow is seen. No adnexal mass or pelvic free
fluid.
IMPRESSION: 1. Early intrauterine gestational sac, containing a yolk sac, but no
fetal pole or cardiac activity yet visualized. Recommend follow-up
quantitative B-HCG levels and follow-up US in 7-10 days to assess
viability.
2. No adnexal mass.  Normal sonographic appearance of the ovaries.
